# Patient Record
Sex: Male | Born: 2000 | Race: White | Hispanic: No | Marital: Single | Smoking: Never smoker
Health system: Southern US, Community
[De-identification: ages and names within clinical notes are randomized; demographics above are authoritative.]

## PROBLEM LIST (undated history)

## (undated) DIAGNOSIS — R011 Cardiac murmur, unspecified: Secondary | ICD-10-CM

## (undated) DIAGNOSIS — L709 Acne, unspecified: Secondary | ICD-10-CM

## (undated) DIAGNOSIS — F909 Attention-deficit hyperactivity disorder, unspecified type: Secondary | ICD-10-CM

---
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
## 2019-12-25 ENCOUNTER — Other Ambulatory Visit: Payer: Self-pay | Admitting: Oncology

## 2019-12-25 DIAGNOSIS — Z20822 Contact with and (suspected) exposure to covid-19: Secondary | ICD-10-CM

## 2019-12-25 NOTE — Progress Notes (Signed)
I connected by phone with  Mr. Jacob Mcdonald  to discuss the potential use of an new treatment for mild to moderate COVID-19 viral infection in non-hospitalized patients.   This patient is a age/sex that meets the FDA criteria for Emergency Use Authorization of casirivimab\imdevimab.  Has a (+) direct SARS-CoV-2 viral test result 1. Has mild or moderate COVID-19  2. Is ? 21 years of age and weighs ? 40 kg 3. Is NOT hospitalized due to COVID-19 4. Is NOT requiring oxygen therapy or requiring an increase in baseline oxygen flow rate due to COVID-19 5. Is within 10 days of symptom onset 6. Has at least one of the high risk factor(s) for progression to severe COVID-19 and/or hospitalization as defined in EUA. ? Specific high risk criteria : Obesity    Symptom onset 12/23/19.    I have spoken and communicated the following to the patient or parent/caregiver:   1. FDA has authorized the emergency use of casirivimab\imdevimab for the treatment of mild to moderate COVID-19 in adults and pediatric patients with positive results of direct SARS-CoV-2 viral testing who are 40 years of age and older weighing at least 40 kg, and who are at high risk for progressing to severe COVID-19 and/or hospitalization.   2. The significant known and potential risks and benefits of casirivimab\imdevimab, and the extent to which such potential risks and benefits are unknown.   3. Information on available alternative treatments and the risks and benefits of those alternatives, including clinical trials.   4. Patients treated with casirivimab\imdevimab should continue to self-isolate and use infection control measures (e.g., wear mask, isolate, social distance, avoid sharing personal items, clean and disinfect "high touch" surfaces, and frequent handwashing) according to CDC guidelines.    5. The patient or parent/caregiver has the option to accept or refuse casirivimab\imdevimab .   After reviewing this information with the  patient, The patient agreed to proceed with receiving casirivimab\imdevimab infusion and will be provided a copy of the Fact sheet prior to receiving the infusion.Mignon Pine, AGNP-C [phone] (Infusion Center Hotline)

## 2019-12-26 ENCOUNTER — Other Ambulatory Visit: Payer: Self-pay

## 2019-12-26 ENCOUNTER — Ambulatory Visit (HOSPITAL_COMMUNITY): Payer: Self-pay

## 2019-12-26 ENCOUNTER — Other Ambulatory Visit: Payer: 59

## 2019-12-26 DIAGNOSIS — Z20822 Contact with and (suspected) exposure to covid-19: Secondary | ICD-10-CM

## 2019-12-28 ENCOUNTER — Telehealth: Payer: Self-pay | Admitting: Physician Assistant

## 2019-12-28 ENCOUNTER — Ambulatory Visit (HOSPITAL_COMMUNITY): Payer: Self-pay

## 2019-12-28 LAB — NOVEL CORONAVIRUS, NAA: SARS-CoV-2, NAA: DETECTED — AB

## 2019-12-28 NOTE — Telephone Encounter (Signed)
Pt decided he didn't want the infusion. Father taking his place.  Cline Crock PA-C  [facility]
